# Patient Record
Sex: Female | Born: 1935 | Race: White | Hispanic: No | Marital: Married
Health system: Southern US, Community
[De-identification: ages and names within clinical notes are randomized; demographics above are authoritative.]

---

## 2006-02-03 ENCOUNTER — Ambulatory Visit: Payer: Self-pay | Admitting: Family Medicine

## 2006-08-17 ENCOUNTER — Ambulatory Visit: Payer: Self-pay

## 2006-10-07 ENCOUNTER — Ambulatory Visit: Payer: Self-pay | Admitting: Family Medicine

## 2006-11-27 ENCOUNTER — Ambulatory Visit: Payer: Self-pay | Admitting: Gastroenterology

## 2007-05-13 ENCOUNTER — Ambulatory Visit: Payer: Self-pay | Admitting: Family Medicine

## 2008-07-27 ENCOUNTER — Ambulatory Visit: Payer: Self-pay | Admitting: Family Medicine

## 2008-11-01 ENCOUNTER — Ambulatory Visit: Payer: Self-pay | Admitting: Neurology

## 2011-08-21 ENCOUNTER — Ambulatory Visit: Payer: Self-pay | Admitting: Family Medicine

## 2013-06-30 ENCOUNTER — Emergency Department: Payer: Self-pay | Admitting: Emergency Medicine

## 2013-06-30 LAB — CBC WITH DIFFERENTIAL/PLATELET
BASOS PCT: 1 %
Basophil #: 0.1 10*3/uL (ref 0.0–0.1)
Eosinophil #: 0.1 10*3/uL (ref 0.0–0.7)
Eosinophil %: 1.6 %
HCT: 35.9 % (ref 35.0–47.0)
HGB: 11.9 g/dL — ABNORMAL LOW (ref 12.0–16.0)
LYMPHS PCT: 16.9 %
Lymphocyte #: 0.9 10*3/uL — ABNORMAL LOW (ref 1.0–3.6)
MCH: 30.4 pg (ref 26.0–34.0)
MCHC: 33.2 g/dL (ref 32.0–36.0)
MCV: 92 fL (ref 80–100)
MONO ABS: 0.7 x10 3/mm (ref 0.2–0.9)
Monocyte %: 13.4 %
Neutrophil #: 3.6 10*3/uL (ref 1.4–6.5)
Neutrophil %: 67.1 %
Platelet: 349 10*3/uL (ref 150–440)
RBC: 3.91 10*6/uL (ref 3.80–5.20)
RDW: 14.9 % — ABNORMAL HIGH (ref 11.5–14.5)
WBC: 5.3 10*3/uL (ref 3.6–11.0)

## 2013-06-30 LAB — BASIC METABOLIC PANEL
Anion Gap: 5 — ABNORMAL LOW (ref 7–16)
BUN: 22 mg/dL — ABNORMAL HIGH (ref 7–18)
CALCIUM: 8.7 mg/dL (ref 8.5–10.1)
CO2: 26 mmol/L (ref 21–32)
Chloride: 107 mmol/L (ref 98–107)
Creatinine: 1.25 mg/dL (ref 0.60–1.30)
EGFR (African American): 44 — ABNORMAL LOW
GFR CALC NON AF AMER: 38 — AB
Glucose: 95 mg/dL (ref 65–99)
Osmolality: 279 (ref 275–301)
Potassium: 4 mmol/L (ref 3.5–5.1)
SODIUM: 138 mmol/L (ref 136–145)

## 2013-06-30 LAB — PROTIME-INR
INR: 1.1
Prothrombin Time: 14.1 secs (ref 11.5–14.7)

## 2013-06-30 LAB — APTT: ACTIVATED PTT: 24.9 s (ref 23.6–35.9)

## 2013-12-20 ENCOUNTER — Ambulatory Visit: Payer: Self-pay | Admitting: Internal Medicine

## 2014-01-11 ENCOUNTER — Inpatient Hospital Stay: Payer: Self-pay | Admitting: Internal Medicine

## 2014-01-11 LAB — BASIC METABOLIC PANEL
Anion Gap: 7 (ref 7–16)
BUN: 35 mg/dL — AB (ref 7–18)
CO2: 28 mmol/L (ref 21–32)
CREATININE: 1.32 mg/dL — AB (ref 0.60–1.30)
Calcium, Total: 8.2 mg/dL — ABNORMAL LOW (ref 8.5–10.1)
Chloride: 104 mmol/L (ref 98–107)
EGFR (African American): 49 — ABNORMAL LOW
EGFR (Non-African Amer.): 40 — ABNORMAL LOW
GLUCOSE: 119 mg/dL — AB (ref 65–99)
OSMOLALITY: 287 (ref 275–301)
Potassium: 3.8 mmol/L (ref 3.5–5.1)
SODIUM: 139 mmol/L (ref 136–145)

## 2014-01-11 LAB — CBC WITH DIFFERENTIAL/PLATELET
Basophil #: 0 10*3/uL (ref 0.0–0.1)
Basophil %: 0.2 %
Eosinophil #: 0 10*3/uL (ref 0.0–0.7)
Eosinophil %: 0.4 %
HCT: 28.3 % — AB (ref 35.0–47.0)
HGB: 9.2 g/dL — ABNORMAL LOW (ref 12.0–16.0)
Lymphocyte #: 2.2 10*3/uL (ref 1.0–3.6)
Lymphocyte %: 19 %
MCH: 29.1 pg (ref 26.0–34.0)
MCHC: 32.5 g/dL (ref 32.0–36.0)
MCV: 90 fL (ref 80–100)
Monocyte #: 1.6 x10 3/mm — ABNORMAL HIGH (ref 0.2–0.9)
Monocyte %: 14 %
NEUTROS ABS: 7.7 10*3/uL — AB (ref 1.4–6.5)
Neutrophil %: 66.4 %
PLATELETS: 296 10*3/uL (ref 150–440)
RBC: 3.16 10*6/uL — ABNORMAL LOW (ref 3.80–5.20)
RDW: 14.5 % (ref 11.5–14.5)
WBC: 11.6 10*3/uL — AB (ref 3.6–11.0)

## 2014-01-11 LAB — URINALYSIS, COMPLETE
Bacteria: NONE SEEN
Bilirubin,UR: NEGATIVE
GLUCOSE, UR: NEGATIVE mg/dL (ref 0–75)
NITRITE: NEGATIVE
PH: 7 (ref 4.5–8.0)
Protein: 100
RBC,UR: 20 /HPF (ref 0–5)
SPECIFIC GRAVITY: 1.015 (ref 1.003–1.030)
Squamous Epithelial: NONE SEEN
WBC UR: 299 /HPF (ref 0–5)

## 2014-01-12 LAB — BASIC METABOLIC PANEL
ANION GAP: 6 — AB (ref 7–16)
BUN: 31 mg/dL — AB (ref 7–18)
CALCIUM: 7.9 mg/dL — AB (ref 8.5–10.1)
CO2: 27 mmol/L (ref 21–32)
Chloride: 108 mmol/L — ABNORMAL HIGH (ref 98–107)
Creatinine: 1.23 mg/dL (ref 0.60–1.30)
EGFR (African American): 53 — ABNORMAL LOW
EGFR (Non-African Amer.): 44 — ABNORMAL LOW
Glucose: 98 mg/dL (ref 65–99)
Osmolality: 288 (ref 275–301)
Potassium: 3.5 mmol/L (ref 3.5–5.1)
Sodium: 141 mmol/L (ref 136–145)

## 2014-01-12 LAB — CBC WITH DIFFERENTIAL/PLATELET
BASOS ABS: 0.1 10*3/uL (ref 0.0–0.1)
BASOS PCT: 0.6 %
EOS ABS: 0.1 10*3/uL (ref 0.0–0.7)
Eosinophil %: 1.5 %
HCT: 24.2 % — AB (ref 35.0–47.0)
Lymphocyte #: 1.8 10*3/uL (ref 1.0–3.6)
Lymphocyte %: 21.2 %
MCH: 28.7 pg (ref 26.0–34.0)
MCHC: 31.9 g/dL — ABNORMAL LOW (ref 32.0–36.0)
MCV: 90 fL (ref 80–100)
MONO ABS: 1.2 x10 3/mm — AB (ref 0.2–0.9)
Monocyte %: 14.3 %
NEUTROS ABS: 5.2 10*3/uL (ref 1.4–6.5)
Neutrophil %: 62.4 %
PLATELETS: 246 10*3/uL (ref 150–440)
RBC: 2.69 10*6/uL — ABNORMAL LOW (ref 3.80–5.20)
RDW: 14.1 % (ref 11.5–14.5)
WBC: 8.4 10*3/uL (ref 3.6–11.0)

## 2014-01-12 LAB — HEMOGLOBIN: HGB: 7.7 g/dL — AB (ref 12.0–16.0)

## 2014-01-13 LAB — BASIC METABOLIC PANEL
ANION GAP: 5 — AB (ref 7–16)
BUN: 23 mg/dL — ABNORMAL HIGH (ref 7–18)
CO2: 29 mmol/L (ref 21–32)
CREATININE: 1.07 mg/dL (ref 0.60–1.30)
Calcium, Total: 7.9 mg/dL — ABNORMAL LOW (ref 8.5–10.1)
Chloride: 110 mmol/L — ABNORMAL HIGH (ref 98–107)
EGFR (Non-African Amer.): 51 — ABNORMAL LOW
GLUCOSE: 104 mg/dL — AB (ref 65–99)
Osmolality: 291 (ref 275–301)
Potassium: 3.8 mmol/L (ref 3.5–5.1)
SODIUM: 144 mmol/L (ref 136–145)

## 2014-01-13 LAB — CBC WITH DIFFERENTIAL/PLATELET
BASOS ABS: 0.1 10*3/uL (ref 0.0–0.1)
BASOS PCT: 0.7 %
EOS ABS: 0.2 10*3/uL (ref 0.0–0.7)
Eosinophil %: 3.1 %
HCT: 25.3 % — ABNORMAL LOW (ref 35.0–47.0)
HGB: 8.4 g/dL — ABNORMAL LOW (ref 12.0–16.0)
LYMPHS ABS: 1.5 10*3/uL (ref 1.0–3.6)
LYMPHS PCT: 18.4 %
MCH: 29.8 pg (ref 26.0–34.0)
MCHC: 33.4 g/dL (ref 32.0–36.0)
MCV: 89 fL (ref 80–100)
MONO ABS: 0.8 x10 3/mm (ref 0.2–0.9)
MONOS PCT: 10 %
Neutrophil #: 5.3 10*3/uL (ref 1.4–6.5)
Neutrophil %: 67.8 %
Platelet: 223 10*3/uL (ref 150–440)
RBC: 2.83 10*6/uL — ABNORMAL LOW (ref 3.80–5.20)
RDW: 14.3 % (ref 11.5–14.5)
WBC: 7.9 10*3/uL (ref 3.6–11.0)

## 2014-01-13 LAB — ALBUMIN: ALBUMIN: 2.2 g/dL — AB (ref 3.4–5.0)

## 2014-01-13 LAB — HEMOGLOBIN: HGB: 8.3 g/dL — ABNORMAL LOW (ref 12.0–16.0)

## 2014-01-14 LAB — BASIC METABOLIC PANEL
Anion Gap: 7 (ref 7–16)
BUN: 25 mg/dL — AB (ref 7–18)
CHLORIDE: 107 mmol/L (ref 98–107)
CO2: 27 mmol/L (ref 21–32)
Calcium, Total: 8 mg/dL — ABNORMAL LOW (ref 8.5–10.1)
Creatinine: 1.02 mg/dL (ref 0.60–1.30)
EGFR (African American): >60
EGFR (Non-African Amer.): 54 — ABNORMAL LOW
GLUCOSE: 89 mg/dL (ref 65–99)
Osmolality: 285 (ref 275–301)
Potassium: 4.8 mmol/L (ref 3.5–5.1)
Sodium: 141 mmol/L (ref 136–145)

## 2014-01-14 LAB — CBC WITH DIFFERENTIAL/PLATELET
Basophil #: 0.1 10*3/uL (ref 0.0–0.1)
Basophil %: 0.6 %
Eosinophil #: 0.3 10*3/uL (ref 0.0–0.7)
Eosinophil %: 4.2 %
HCT: 27.3 % — ABNORMAL LOW (ref 35.0–47.0)
LYMPHS ABS: 1.6 10*3/uL (ref 1.0–3.6)
LYMPHS PCT: 19.5 %
MCH: 30 pg (ref 26.0–34.0)
MCHC: 33.2 g/dL (ref 32.0–36.0)
MCV: 90 fL (ref 80–100)
MONO ABS: 0.9 x10 3/mm (ref 0.2–0.9)
MONOS PCT: 11 %
Neutrophil #: 5.2 10*3/uL (ref 1.4–6.5)
Neutrophil %: 64.7 %
PLATELETS: 261 10*3/uL (ref 150–440)
RBC: 3.02 10*6/uL — AB (ref 3.80–5.20)
RDW: 13.9 % (ref 11.5–14.5)
WBC: 8.1 10*3/uL (ref 3.6–11.0)

## 2014-01-14 LAB — HEMOGLOBIN: HGB: 9.1 g/dL — ABNORMAL LOW (ref 12.0–16.0)

## 2014-01-14 LAB — URINE CULTURE

## 2014-01-19 ENCOUNTER — Ambulatory Visit: Payer: Self-pay | Admitting: Internal Medicine

## 2014-02-19 DEATH — deceased

## 2014-08-12 NOTE — H&P (Signed)
PATIENT NAME:  Yesenia [name], Yesenia [name] MR#:  [id number] DATE OF BIRTH:  Aug 03, 1922  DATE OF ADMISSION:  01/11/2014  PRIMARY CARE PROVIDER: Doctor making house call.   EMERGENCY DEPARTMENT REFERRING PHYSICIAN: Coolidge Breeze, MD  CHIEF COMPLAINT: Fall.   HISTORY OF PRESENT ILLNESS: The patient is an 81 year old white female who currently resides in an Alzheimer unit at [facility] who has had a history of several falls, who last fell yesterday and was having difficulty with placing weight on her right leg. They checked an x-ray and was noted to have a hip fracture. Therefore, she was sent to the ED here. Currently, the patient is sedated from pain medications, however, at baseline she has very advanced dementia and she is not able to recognize her family members. According to the son and her husband, there is no history of heart disease, except a history of heart murmur.   PAST MEDICAL HISTORY:  1.  Hypertension.  2.  Hypothyroidism.  3.  Advanced dementia.  4.  Hyperlipidemia. 5.  History of heart murmur.   PAST SURGICAL HISTORY: Status post thyroidectomy and hysterectomy.   ALLERGIES: ADVIL, ALEVE, CODEINE, NAPROXEN, PENICILLIN.   MEDICATIONS: Vitamin D3 at 1000 international units daily, vitamin B12 at 1000 mcg daily, pravastatin 40 at bedtime, naproxen 220 one tablet p.o. b.i.d., metoprolol 50 one tablet p.o. b.i.d., Maxzide 25/37.5 one tablet p.o. daily, levothyroxine 125 mcg daily, aspirin 81 one tablet p.o. daily.   SOCIAL HISTORY: No smoking. No alcohol or drug use.   FAMILY HISTORY: Unknown.   REVIEW OF SYSTEMS: Unobtainable due to the patient's advanced dementia.   PHYSICAL EXAMINATION:  VITAL SIGNS: Temperature 98.2, pulse 53, respirations 20, blood pressure 119/57, O2 of 97%.  GENERAL: The patient is a frail, elderly female. Currently sedated.  HEENT: Head atraumatic, normocephalic. Pupils equally round, reactive to light and accommodation. There is no conjunctival pallor. No  scleral icterus. Oropharynx: She is refusing to open her mouth. External ear examination shows no drainage or ulceration. Nasal examination shows no drainage or ulceration.  NECK: Supple without any thyromegaly. No carotid bruits.  CARDIOVASCULAR: Systolic murmur at the left sternal border. No gallops. PMI is not displaced.  LUNGS: Clear to auscultation bilaterally without any rales, rhonchi, wheezing.  ABDOMEN: Soft, nontender, nondistended. Positive bowel sounds x 4. No hepatosplenomegaly.  EXTREMITIES: No clubbing, cyanosis or edema.  SKIN: No rash.  LYMPH NODES: Nonpalpable.  VASCULAR: Good DP, PT pulses.  PSYCHIATRIC: Not anxious or depressed.  NEUROLOGICAL: The patient is sleepy currently, unable to do a neurological examination.  MUSCULOSKELETAL: There is no erythema or swelling.   LABORATORY DATA: Glucose 119, BUN 35, creatinine 1.32, sodium 139, potassium 3.9, chloride 104, CO2 of 28, calcium 8.2. WBC 11.6, hemoglobin 9.2, platelet count 296,000. Chest x-ray suggests a possible mild interstitial edema. Right hip intertrochanteric right femur fracture.   ASSESSMENT AND PLAN: The patient is an 81 year old with advanced dementia status post fall with right hip fracture.  1.  Right hip fracture. We will get orthopedic consult. Family will decide with orthopedics regarding surgery. No further cardiopulmonary workup needed.  2.  Possible urinary tract infection. We will get a urinalysis.  3.  Hypothyroidism. Continue levothyroxine.  4.  Hypotension. We will hold metoprolol, give her IV fluids.  5.  Hyperlipidemia. Continue pravastatin.   CODE STATUS: DNR confirmed with the family.  TIME SPENT: Please note, time spent 55 minutes on this patient.    ____________________________ Lacie Scotts. Allena Katz, MD shp:TT D: 01/11/2014  20:31:04 ET T: 01/11/2014 21:07:18 ET JOB#: [id number]  cc: Aine Strycharz H. Allena KatzPatel, MD, <Dictator> Charise CarwinSHREYANG H Juwana Thoreson MD ELECTRONICALLY SIGNED 01/20/2014 17:57

## 2014-08-12 NOTE — Discharge Summary (Signed)
PATIENT NAME:  Yesenia [name], Yesenia [name] MR#:  [id number] DATE OF BIRTH:  1922-12-24  DATE OF ADMISSION:  01/11/2014 DATE OF DISCHARGE: 01/14/2014    ADMISSION DIAGNOSIS: Right hip fracture.   DISCHARGE DIAGNOSES:  1.  Right intertrochanteric subtrochanteric hip fracture after a mechanical fall.  2.  Severe Alzheimer's dementia.  3.  Urinary tract infection Streptococcus viridans.  4. AMS multifactorial  CONSULT : ORTHO PALLIATVE CARE  HOSPITAL COURSE: This is an 81 year old female with advanced dementia who is status post a fall with right hip fracture. For further details, please refer to H and [name].   1.  Altered mental status likely multifactorial secondary to her surgery, advanced dementia and urinary tract.  2.  Right hip fracture. The patient is postoperative day #2 for open reduction internal fixation, will go to a skilled nursing facility with hospice, continues deep vein thrombosis prophylaxis per Ortho.   The patient did have some acute blood loss anemia associated with her surgery. She is on ferrous sulfate.  3.  Urinary tract infection. The patient will be transitioned from Rocephin to [name].o. Keflex.  Urine cultures positive for strep viridans. 4.  Hypothyroidism on Synthroid. 5.  Hypotension, resolved.  The patient will resume her hypertensive medications.  6 .  Hyperlipidemia. The patient is on pravastatin.   DISCHARGE MEDICATIONS:  1.  Synthroid 125 mg daily. 2.  Maxzide 25/37.5 daily.  3.  Metoprolol 50 b.i.d.  4.  Prilosec 40 mg daily.  5.  Vitamin B12 at 500 mg daily. 6.  Vitamin D3 at 1000 international units daily. 7.  Ferrous sulfate 325 b.i.d.  8.  Bisacodyl rectal suppository [name].r.n. daily.  9.  Keflex 500 mg [name].o. t.i.d. for 8 days.  10. Acetaminophen hydrocodone 5/325 every 4 to 6 hours [name].r.n. pain. 11.  Enoxaparin (Lovenox) 40 mg subcutaneous daily for 14 days, then discontinue and begin aspirin 81 mg for 6 weeks.   Please see orthopedics discharge instructions for deep  vein thrombosis prophylaxis.   DISCHARGE DIET: Regular diet with mechanical soft diet.   DISCHARGE ACTIVITY: As tolerated.   DISCHARGE REFERRAL: Hospice.   FOLLOW-UP:  The patient can follow up with Wonda ChengJoel Moffett, MD in 1 to 2 weeks.   TIME SPENT: Approximately 35 minutes.      PROGNOSIS:  The patient has a guarded prognosis but stable for discharge to skilled nursing facility.        ____________________________ Ulyess Muto [name]. Juliene PinaMody, MD spm:DT D: 01/14/2014 09:33:59 ET T: 01/14/2014 10:02:00 ET JOB#: [id number]  cc: Billijo Dilling [name]. Juliene PinaMody, MD, <Dictator> Janyth ContesSITAL [name] Guerin Lashomb MD ELECTRONICALLY SIGNED 01/14/2014 11:07

## 2014-08-12 NOTE — Consult Note (Signed)
Brief Consult Note: Diagnosis: right intertrochanteric hip fracture.   Patient was seen by consultant.   Recommend to proceed with surgery or procedure.   Orders entered.   Comments: discussed with son, will talk with husband and sone tomorrow am.  Recommend ORIF with IM rod for pain relief, mobility.  Electronic Signatures: Leitha SchullerMenz, Dacoda Spallone J (MD)  (Signed 23-Sep-15 21:34)  Authored: Brief Consult Note   Last Updated: 23-Sep-15 21:34 by Leitha SchullerMenz, Orissa Arreaga J (MD)

## 2014-08-12 NOTE — Op Note (Signed)
PATIENT NAME:  Randalyn RheaRUDD, Anjuli P MR#:  [id number] DATE OF BIRTH:  [birth year]  DATE OF PROCEDURE:  01/12/2014  PREOPERATIVE DIAGNOSIS:  Right intertrochanteric subtrochanteric hip fracture.   POSTOPERATIVE DIAGNOSIS:  Right intertrochanteric subtrochanteric hip fracture.   PROCEDURE:  Open reduction and internal fixation with right long Affixus intramedullary rod.   ANESTHESIA:  Spinal.   SURGEON:  Kennedy BuckerMichael Raeshaun Simson, MD   DESCRIPTION OF PROCEDURE:  The patient was brought to the operating room, and after adequate anesthesia was obtained, the patient was placed on the operative table with fracture table, left leg in the well leg holder, and traction and internal rotation applied to the leg with acceptable reduction applied. After patient identification and timeout procedures were completed and after having prepped and draped in the usual sterile fashion using a barrier drape method, a proximal incision was made and a guidewire inserted into the tip of the trochanter. Proximal reaming was carried out and a guidewire inserted. Measurements off of this led to a 380 rod being chosen. Reaming was carried out to 11 mm, at which point the canal was tight. A 9 x 380 rod was then inserted down the canal to the appropriate level. A small lateral incision was made and a guidewire inserted into the center position on the head. The measurements were made, drilling and inflation of the screw with good bite into the femoral head. The proximal locking screw was set and then slightly loosened to allow for compression of the lag screw. Next, a distal interlocking screw was placed using freehand technique, getting the oblong hole visualized, small stab incision, drilling, measuring, and placing of a 5.0 cortical screw. AP and lateral of the proximal femur and AP of the distal femur were obtained, showing acceptable alignment. The wounds were irrigated and closed using 0 Vicryl deep, 2-0 Vicryl subcutaneously, and skin staples.  Xeroform, 4 x 4's, ABD, and tape applied.   ESTIMATED BLOOD LOSS:  100 mL.   COMPLICATIONS:  None.   SPECIMEN: None.     ____________________________ Leitha SchullerMichael J. Nikolaos Maddocks, MD mjm:nb D: 01/12/2014 20:21:15 ET T: 01/13/2014 03:48:35 ET JOB#: [id number]  cc: Leitha SchullerMichael J. Joye Wesenberg, MD, <Dictator> Leitha SchullerMICHAEL J Lianni Kanaan MD ELECTRONICALLY SIGNED 01/13/2014 17:02

## 2015-05-07 IMAGING — CR RIGHT HIP - COMPLETE 2+ VIEW
1 series · 5 of 5 positions shown · non-contrast
Comparison: None.

CLINICAL DATA: Fell today with pain in deformity right hip

EXAM:
RIGHT HIP - COMPLETE 2+ VIEW

[Series 1: dxr hip right complete · 0.14mm/px · 5 of 5 slices shown]
[im 1/5]
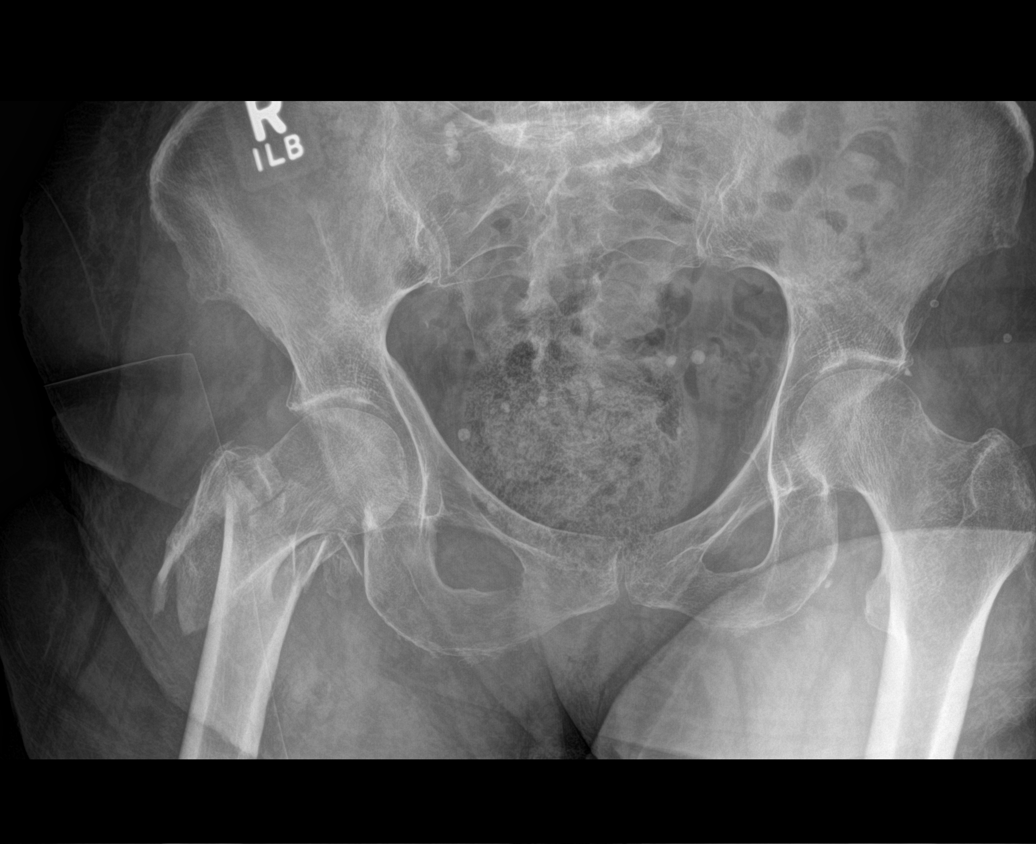
[im 2/5]
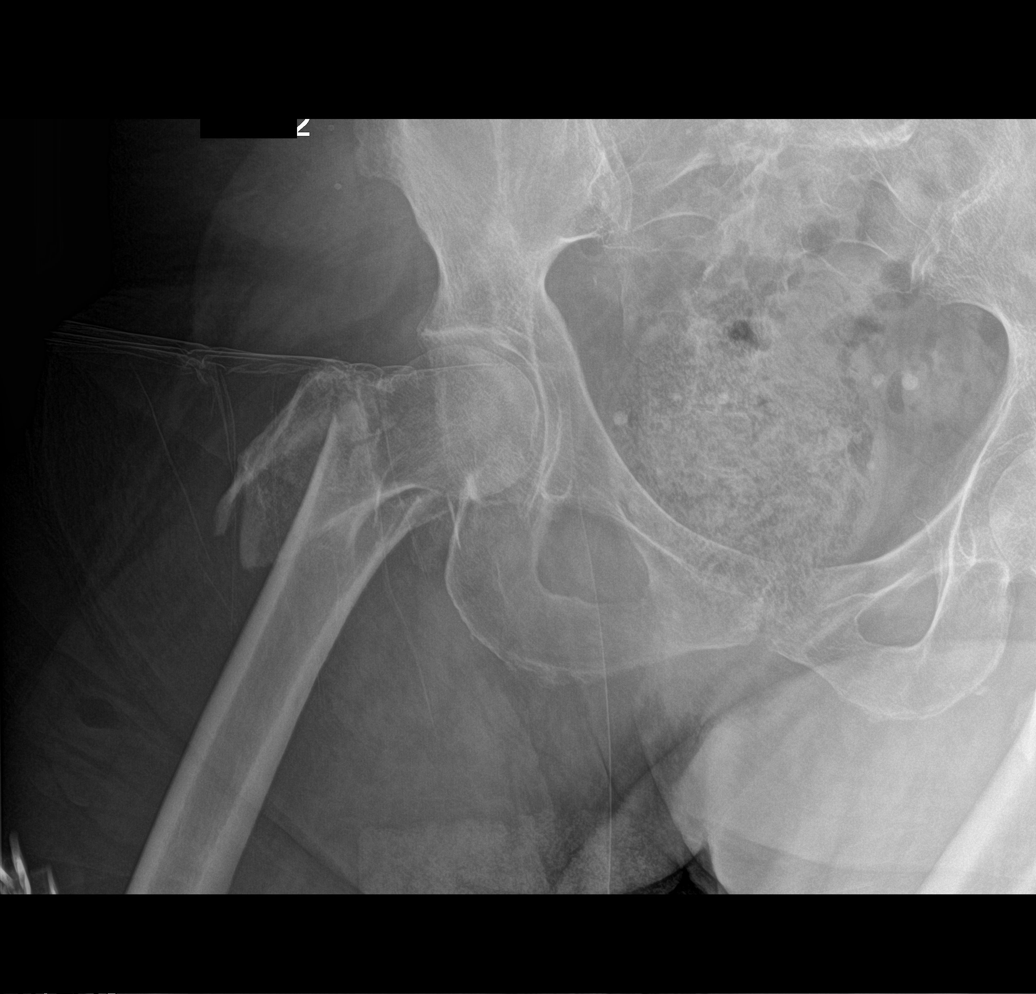
[im 3/5]
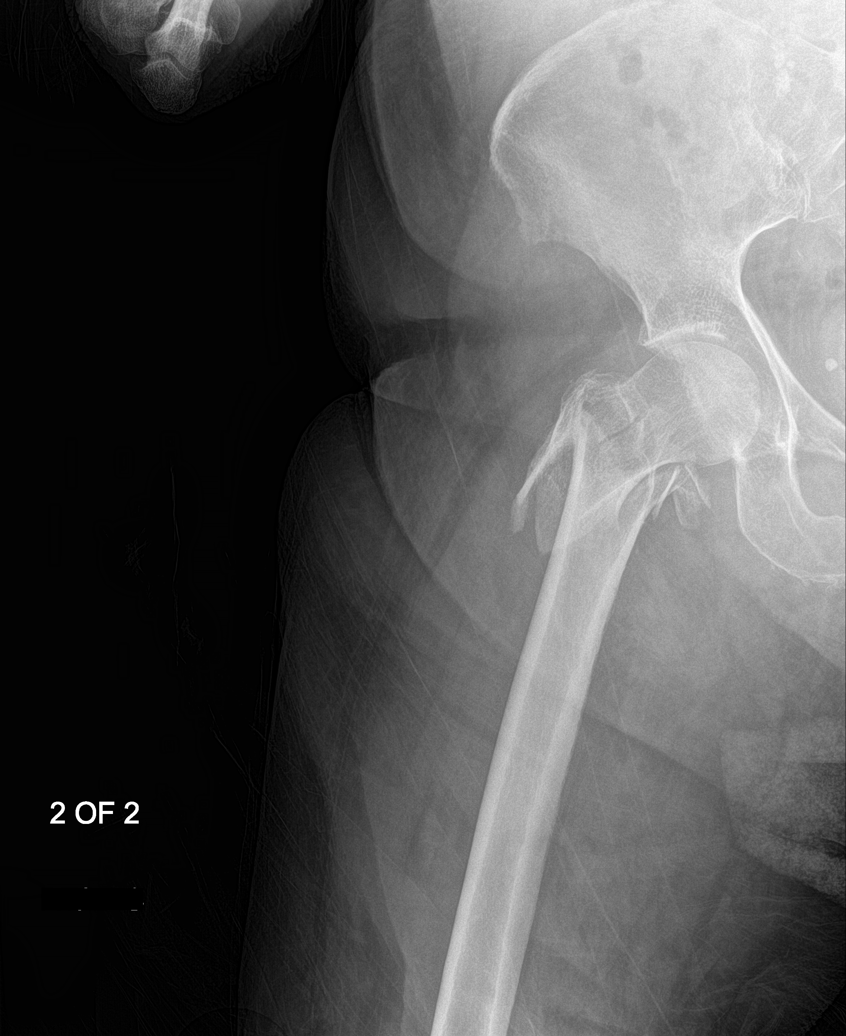
[im 4/5]
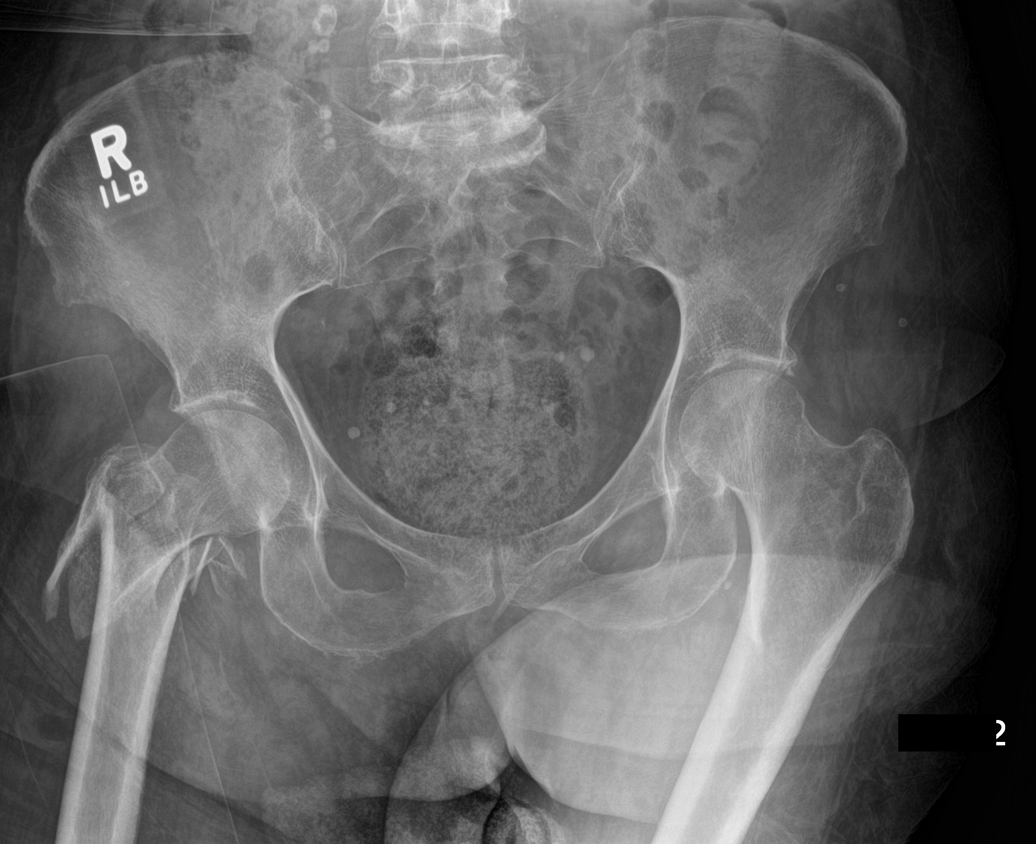
[im 5/5]
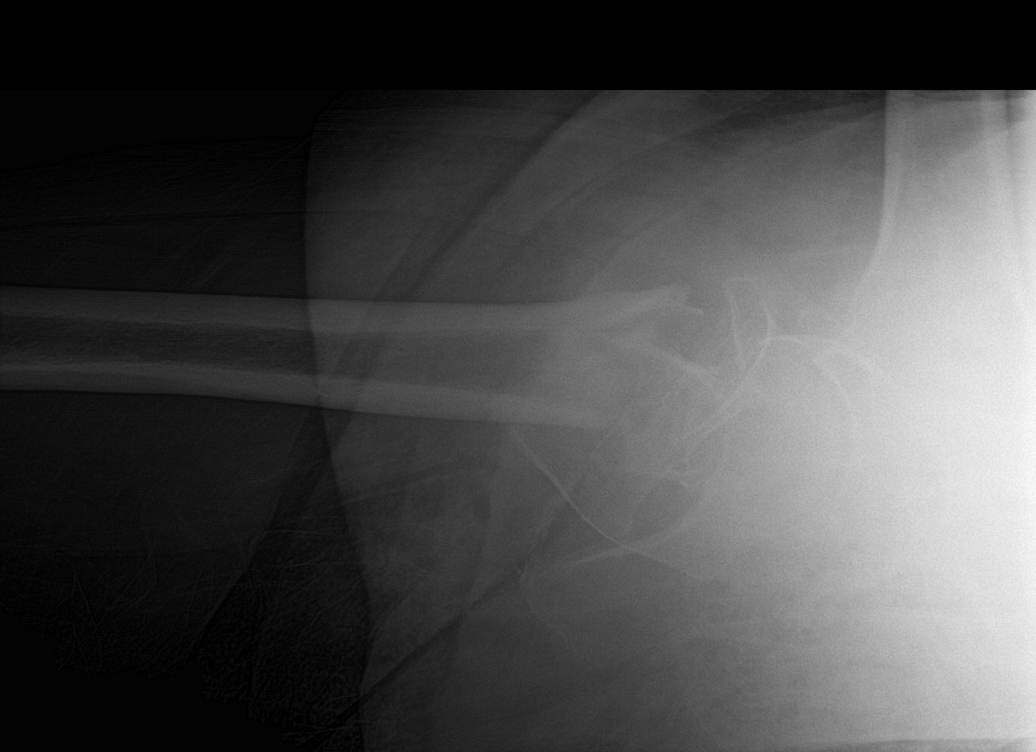

[5 of 5 positions shown; findings below may reference images not displayed]

FINDINGS: There is a comminuted intertrochanteric right femur fracture with
moderate varus angulation and moderate displacement of fracture
fragments. Pelvic bones are intact. Stool distends the rectum to a
diameter of 9 cm.
IMPRESSION: Intertrochanteric right femur fracture

## 2015-05-08 IMAGING — CR DG C-ARM 1-60 MIN-NO REPORT
3 series · 3 of 3 positions shown · non-contrast
Comparison: 01/11/2014

Fluoro time:  1 min, 49 seconds

CLINICAL DATA: ORIF right hip fracture

EXAM:
RIGHT HIP - 1 VIEW; DG C-ARM 1-60 MIN-NO REPORT

[m1]
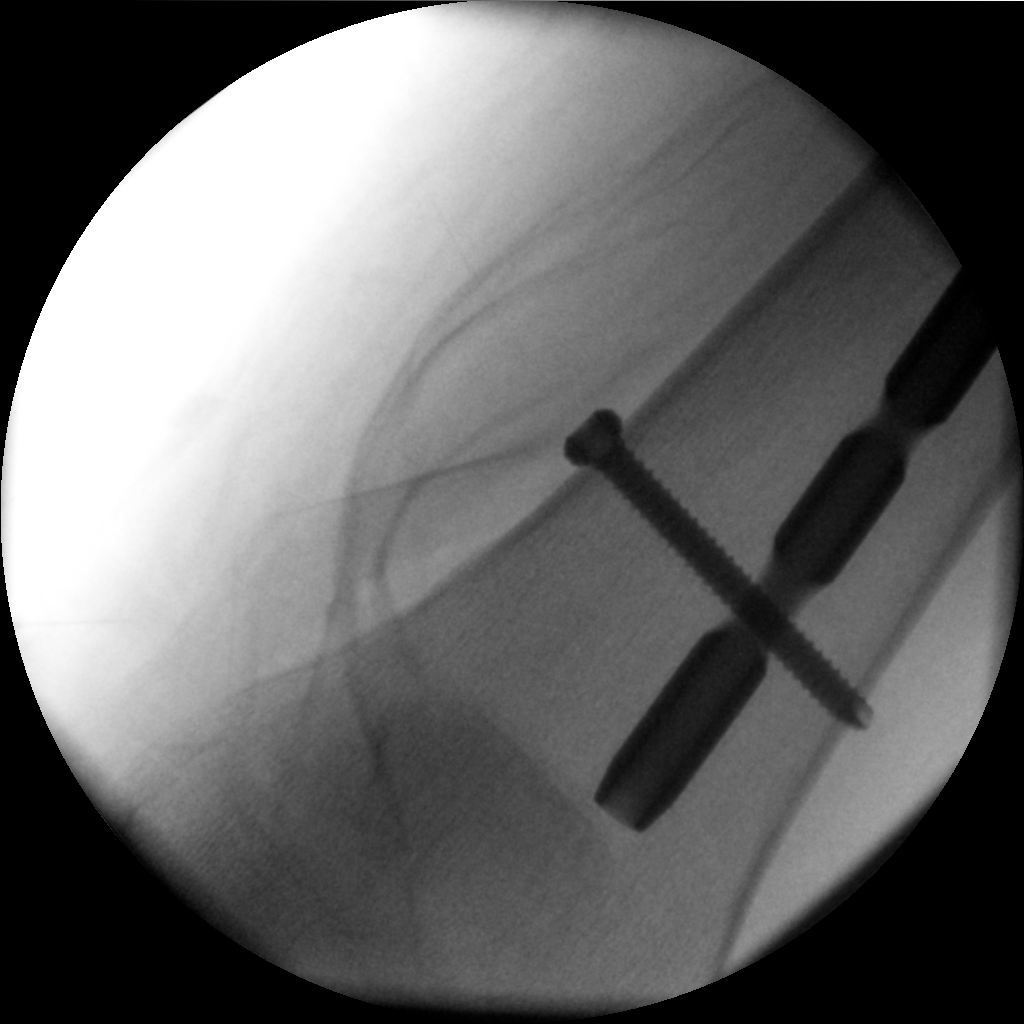

[m2]
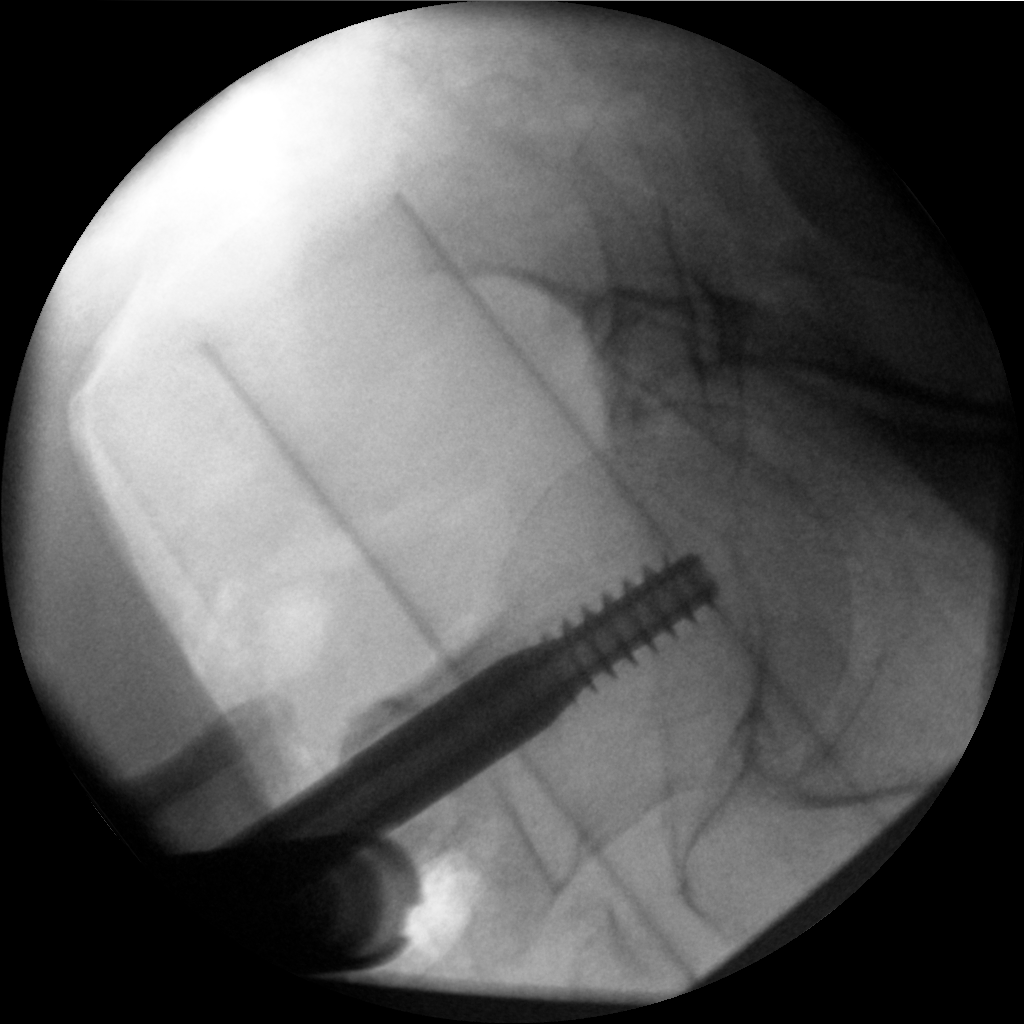

[m3]
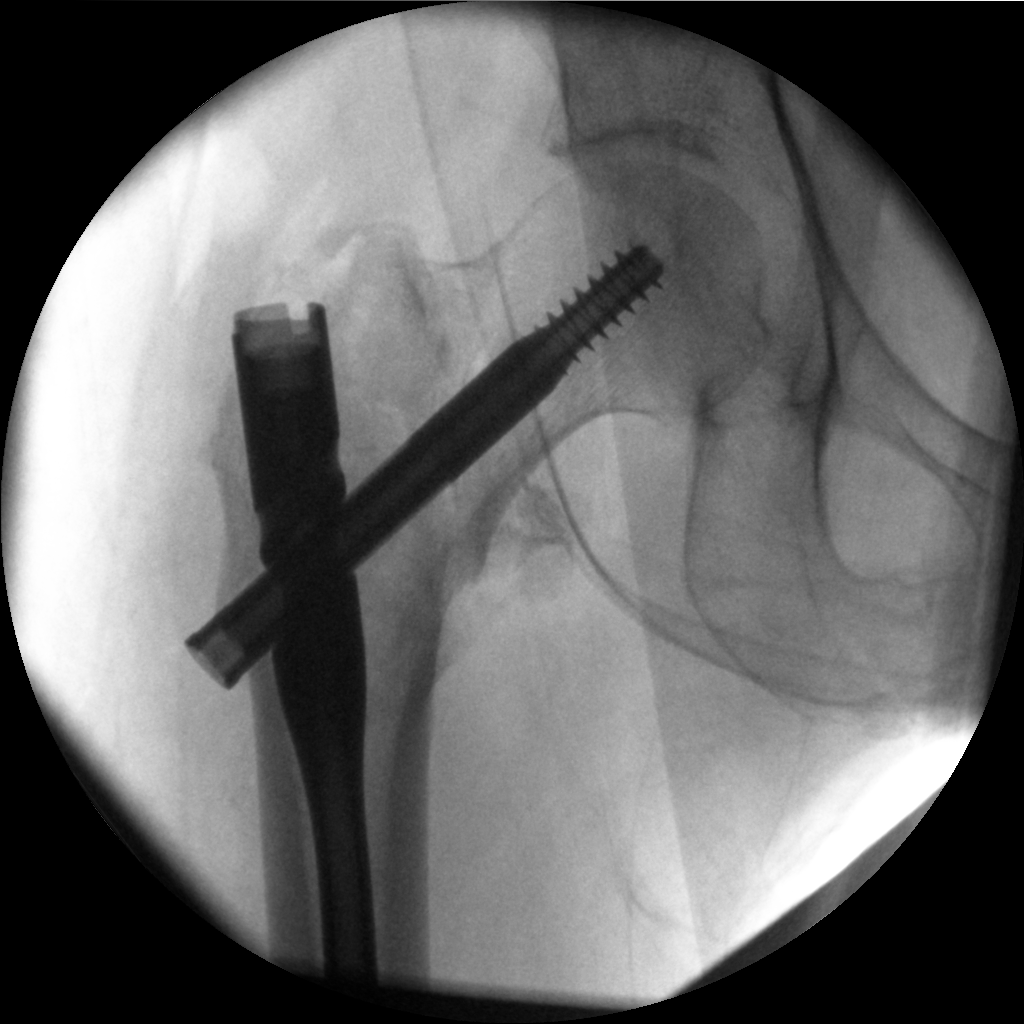

[3 of 3 positions shown; findings below may reference images not displayed]

FINDINGS: Patient status post intratrochanteric rod fixation of
intertrochanteric right proximal femur fracture.
IMPRESSION: Patient status post ORIF right proximal femur fracture.
